# Patient Record
Sex: Male | Born: 1950 | Race: White | Hispanic: No | Marital: Single | Smoking: Never smoker
Health system: Southern US, Community
[De-identification: ages and names within clinical notes are randomized; demographics above are authoritative.]

## PROBLEM LIST (undated history)

## (undated) DIAGNOSIS — I219 Acute myocardial infarction, unspecified: Secondary | ICD-10-CM

## (undated) DIAGNOSIS — I10 Essential (primary) hypertension: Secondary | ICD-10-CM

## (undated) HISTORY — PX: CORONARY ARTERY BYPASS GRAFT: SHX141

---

## 2009-05-23 ENCOUNTER — Emergency Department: Payer: Self-pay

## 2010-04-11 ENCOUNTER — Inpatient Hospital Stay: Payer: Self-pay | Admitting: Internal Medicine

## 2010-08-01 ENCOUNTER — Observation Stay: Payer: Self-pay | Admitting: Internal Medicine

## 2013-04-14 ENCOUNTER — Emergency Department: Payer: Self-pay | Admitting: Emergency Medicine

## 2013-04-14 LAB — TROPONIN I

## 2013-04-14 LAB — CBC
HCT: 45.8 % (ref 40.0–52.0)
HGB: 15.3 g/dL (ref 13.0–18.0)
MCH: 29 pg (ref 26.0–34.0)
MCHC: 33.4 g/dL (ref 32.0–36.0)
MCV: 87 fL (ref 80–100)
Platelet: 181 10*3/uL (ref 150–440)
RBC: 5.27 10*6/uL (ref 4.40–5.90)
RDW: 15.6 % — AB (ref 11.5–14.5)
WBC: 7.5 10*3/uL (ref 3.8–10.6)

## 2013-04-14 LAB — BASIC METABOLIC PANEL
ANION GAP: 6 — AB (ref 7–16)
BUN: 20 mg/dL — AB (ref 7–18)
CALCIUM: 9.7 mg/dL (ref 8.5–10.1)
CO2: 24 mmol/L (ref 21–32)
Chloride: 102 mmol/L (ref 98–107)
Creatinine: 0.83 mg/dL (ref 0.60–1.30)
EGFR (African American): >60
Glucose: 146 mg/dL — ABNORMAL HIGH (ref 65–99)
OSMOLALITY: 270 (ref 275–301)
POTASSIUM: 4.4 mmol/L (ref 3.5–5.1)
Sodium: 132 mmol/L — ABNORMAL LOW (ref 136–145)

## 2015-02-20 ENCOUNTER — Emergency Department: Payer: Medicare Other

## 2015-02-20 ENCOUNTER — Emergency Department
Admission: EM | Admit: 2015-02-20 | Discharge: 2015-02-20 | Disposition: A | Discharge status: Home / Self Care | Payer: Medicare Other | Attending: Emergency Medicine | Admitting: Emergency Medicine

## 2015-02-20 ENCOUNTER — Encounter: Payer: Self-pay | Admitting: Emergency Medicine

## 2015-02-20 DIAGNOSIS — M25551 Pain in right hip: Secondary | ICD-10-CM | POA: Diagnosis present

## 2015-02-20 DIAGNOSIS — Z880 Allergy status to penicillin: Secondary | ICD-10-CM | POA: Diagnosis not present

## 2015-02-20 DIAGNOSIS — I10 Essential (primary) hypertension: Secondary | ICD-10-CM | POA: Diagnosis not present

## 2015-02-20 HISTORY — DX: Acute myocardial infarction, unspecified: I21.9

## 2015-02-20 HISTORY — DX: Essential (primary) hypertension: I10

## 2015-02-20 MED ORDER — TRAMADOL HCL 50 MG PO TABS: 50.0000 mg | ORAL_TABLET | Freq: Four times a day (QID) | ORAL | Status: DC | PRN

## 2015-02-20 NOTE — ED Notes (Signed)
Pt states here due to him pain since 10/31 went to see his doctor for it but states what was given isn't helping. Pt denies fall but states he walks a lot to help his heart walked a mile and then went to bed woke up with the pain and it hasn't gotten any better only worse

## 2015-02-20 NOTE — ED Notes (Addendum)
Pt reports right hip pain since 10/31; went and saw PCP, given rx but pt reports continued pain. Pt given gabapentin and meloxicam.

## 2015-02-20 NOTE — ED Provider Notes (Signed)
[facility] Emergency Department Provider Note  ____________________________________________  Time seen: Approximately 11:51 AM  I have reviewed the triage vital signs and the nursing notes.   HISTORY  Chief Complaint Hip Pain   HPI Ian Oliver is a 64 y.o. male is here with complaint of right hip pain. Patient states that he saw his doctor on 10/31 and was started on gabapentin and meloxicam. He is taking medication as prescribed and has seen no improvement. Patient states that he walks a lot due to heart disease which is aggravating his hip as well. Patient denies any injury to his hip. He states that first thing in the morning he is unable to get out of bed unless he has his cane. He denies any paresthesias in his leg or any low back pain. Currently he rates his pain as a 6 out of 10.Pain is constant right lateral hip and nonradiating.   Past Medical History  Diagnosis Date  . Hypertension   . Heart attack (HCC)     There are no active problems to display for this patient.   Past Surgical History  Procedure Laterality Date  . Coronary artery bypass graft      Current Outpatient Rx  Name  Route  Sig  Dispense  Refill  . traMADol (ULTRAM) 50 MG tablet   Oral   Take 1 tablet (50 mg total) by mouth every 6 (six) hours as needed.   20 tablet   0     Allergies Aspirin and Penicillins  No family history on file.  Social History Social History  Substance Use Topics  . Smoking status: Never Smoker   . Smokeless tobacco: None  . Alcohol Use: No    Review of Systems Constitutional: No fever/chills Cardiovascular: Denies chest pain. Respiratory: Denies shortness of breath. Gastrointestinal:  No nausea, no vomiting.  Genitourinary: Negative for dysuria. Musculoskeletal: Negative for back pain. Positive right hip pain Skin: Negative for rash. Neurological: Negative for headaches, focal weakness or numbness.  10-point ROS otherwise  negative.  ____________________________________________   PHYSICAL EXAM:  VITAL SIGNS: ED Triage Vitals  Enc Vitals Group     BP --      Pulse Rate 02/20/15 1119 57     Resp 02/20/15 1119 16     Temp 02/20/15 1119 97.8 F (36.6 C)     Temp Source 02/20/15 1119 Oral     SpO2 02/20/15 1119 96 %     Weight 02/20/15 1119 260 lb (117.935 kg)     Height 02/20/15 1119 6' (1.829 m)     Head Cir --      Peak Flow --      Pain Score 02/20/15 1120 6     Pain Loc --      Pain Edu? --      Excl. in GC? --     Constitutional: Alert and oriented. Well appearing and in no acute distress. Eyes: Conjunctivae are normal. PERRL. EOMI. Head: Atraumatic. Nose: No congestion/rhinnorhea. Neck: No stridor.   Cardiovascular: Normal rate, regular rhythm. Grossly normal heart sounds.  Good peripheral circulation. Respiratory: Normal respiratory effort.  No retractions. Lungs CTAB. Gastrointestinal: Soft and nontender. No distention. Musculoskeletal: Right hip service to palpation of the lateral and anterior lateral aspect. Range of motion increases pain. No gross deformity was noted. Patient is ambulatory in the ER with cane. Neurologic:  Normal speech and language. No gross focal neurologic deficits are appreciated. No gait instability while using a cane  area Skin:  Skin is warm, dry and intact. No rash noted. Psychiatric: Mood and affect are normal. Speech and behavior are normal.  ____________________________________________   LABS (all labs ordered are listed, but only abnormal results are displayed)  Labs Reviewed - No data to display _RADIOLOGY  Right hip x-ray per radiologist shows no evidence of hip fracture or dislocation. ____________________________________________   PROCEDURES  Procedure(s) performed: None  Critical Care performed: No  ____________________________________________   INITIAL IMPRESSION / ASSESSMENT AND PLAN / ED COURSE  Pertinent labs & imaging results  that were available during my care of the patient were reviewed by me and considered in my medical decision making (see chart for details).  Patient is continue taking meloxicam and gabapentin as prescribed by his doctor. He was given a prescription for tramadol 50 mg 1 every 6 hours when necessary severe pain. Patient is aware that this medication may cause drowsiness and increase his risk for falling. He will follow up with his primary care doctor if any continued problems. ____________________________________________   FINAL CLINICAL IMPRESSION(S) / ED DIAGNOSES  Final diagnoses:  Acute pain of right hip      Tommi Rumps, PA-C 02/20/15 1332  Emily Filbert, MD 02/20/15 [id number]

## 2017-02-14 ENCOUNTER — Encounter: Payer: Self-pay | Admitting: Emergency Medicine

## 2017-02-14 ENCOUNTER — Emergency Department
Admission: EM | Admit: 2017-02-14 | Discharge: 2017-02-14 | Disposition: A | Discharge status: Home / Self Care | Payer: Medicare Other | Attending: Emergency Medicine | Admitting: Emergency Medicine

## 2017-02-14 DIAGNOSIS — R0789 Other chest pain: Secondary | ICD-10-CM | POA: Insufficient documentation

## 2017-02-14 DIAGNOSIS — T887XXA Unspecified adverse effect of drug or medicament, initial encounter: Secondary | ICD-10-CM | POA: Insufficient documentation

## 2017-02-14 DIAGNOSIS — T43595A Adverse effect of other antipsychotics and neuroleptics, initial encounter: Secondary | ICD-10-CM | POA: Diagnosis not present

## 2017-02-14 DIAGNOSIS — I10 Essential (primary) hypertension: Secondary | ICD-10-CM | POA: Insufficient documentation

## 2017-02-14 DIAGNOSIS — Y658 Other specified misadventures during surgical and medical care: Secondary | ICD-10-CM | POA: Insufficient documentation

## 2017-02-14 DIAGNOSIS — F419 Anxiety disorder, unspecified: Secondary | ICD-10-CM | POA: Diagnosis not present

## 2017-02-14 DIAGNOSIS — H538 Other visual disturbances: Secondary | ICD-10-CM | POA: Diagnosis present

## 2017-02-14 DIAGNOSIS — T50905A Adverse effect of unspecified drugs, medicaments and biological substances, initial encounter: Secondary | ICD-10-CM

## 2017-02-14 LAB — COMPREHENSIVE METABOLIC PANEL
ALT: 18 U/L (ref 17–63)
AST: 30 U/L (ref 15–41)
Albumin: 4 g/dL (ref 3.5–5.0)
Alkaline Phosphatase: 62 U/L (ref 38–126)
Anion gap: 8 (ref 5–15)
BUN: 18 mg/dL (ref 6–20)
CHLORIDE: 104 mmol/L (ref 101–111)
CO2: 26 mmol/L (ref 22–32)
CREATININE: 0.89 mg/dL (ref 0.61–1.24)
Calcium: 9.3 mg/dL (ref 8.9–10.3)
GFR calc non Af Amer: >60 mL/min (ref >60)
Glucose, Bld: 100 mg/dL — ABNORMAL HIGH (ref 65–99)
Potassium: 4.6 mmol/L (ref 3.5–5.1)
SODIUM: 138 mmol/L (ref 135–145)
Total Bilirubin: 1.5 mg/dL — ABNORMAL HIGH (ref 0.3–1.2)
Total Protein: 7.7 g/dL (ref 6.5–8.1)

## 2017-02-14 LAB — GLUCOSE, CAPILLARY: Glucose-Capillary: 97 mg/dL (ref 65–99)

## 2017-02-14 LAB — TROPONIN I

## 2017-02-14 MED ORDER — CLONAZEPAM 0.5 MG PO TABS: 0.5000 mg | ORAL_TABLET | Freq: Two times a day (BID) | ORAL | 0 refills | Status: AC

## 2017-02-14 MED ORDER — CLONAZEPAM 0.5 MG PO TABS
0.5000 mg | ORAL_TABLET | Freq: Once | ORAL | Status: AC
  Administered 2017-02-14: 0.5 mg via ORAL
  Filled 2017-02-14: qty 1

## 2017-02-14 NOTE — ED Provider Notes (Signed)
[facility] Emergency Department Provider Note   ____________________________________________   I have reviewed the triage vital signs and the nursing notes.   HISTORY  Chief Complaint Medication Reaction    HPI Ian Oliver is a 66 y.o. male presents to the emergency department with symptoms he began to experience after medication change during a primary care visit approximately 2 weeks ago.  Patient has been taking Klonopin 0.5 mg 3 TID for several years when he was abruptly changed to buspirone 15 mg BID.  Patient noted immediately "not feeling well", blurred vision, chest discomfort and "growling in his chest" during exertional activities.  States he walks a lot and since he has been taking the new medication symptoms have limited him from walking as much. Patient also noted not sleeping well.  Patient became concerned with symptoms because he has a significant cardiac history and wanted to be examined. Patient denies fever, chills, headache, vision changes, chest pain, chest tightness, shortness of breath, abdominal pain, nausea and vomiting.  Past Medical History:  Diagnosis Date  . Heart attack (HCC)   . Hypertension     There are no active problems to display for this patient.   Past Surgical History:  Procedure Laterality Date  . CORONARY ARTERY BYPASS GRAFT      Prior to Admission medications   Medication Sig Start Date End Date Taking? Authorizing Provider  clonazePAM (KLONOPIN) 0.5 MG tablet Take 1 tablet (0.5 mg total) 2 (two) times daily for 14 days by mouth. 02/14/17 02/28/17  Little, Traci M, PA-C  traMADol (ULTRAM) 50 MG tablet Take 1 tablet (50 mg total) by mouth every 6 (six) hours as needed. 02/20/15   Tommi RumpsSummers, Rhonda L, PA-C    Allergies Aspirin and Penicillins  No family history on file.  Social History Social History   Tobacco Use  . Smoking status: Never Smoker  Substance Use Topics  . Alcohol use: No  . Drug  use: Not on file    Review of Systems Constitutional: Negative for fever/chills Eyes: Positive for intermittent blurred vision ENT:  Negative for sore throat and for difficulty swallowing Cardiovascular: Exertional chest discomfort.  Respiratory: Denies cough. Denies shortness of breath. Gastrointestinal: No abdominal pain.  No nausea, vomiting, diarrhea. Musculoskeletal: Negative for back pain. Skin: Negative for rash. Neurological: Negative for headaches. ____________________________________________   PHYSICAL EXAM:  VITAL SIGNS: Patient Vitals for the past 24 hrs:  BP Temp Temp src Pulse Resp SpO2 Height Weight  02/14/17 1239 138/78 - - (!) 56 16 95 % - -  02/14/17 1030 (!) 136/97 98 F (36.7 C) Oral (!) 41 16 99 % - -  02/14/17 0912 - - - - - - 6' (1.829 m) 117.9 kg (260 lb)  02/14/17 0909 (!) 135/58 98.1 F (36.7 C) Oral (!) 44 16 99 % - -    Constitutional: Alert and oriented. Well appearing and in no acute distress.  Eyes: Conjunctivae are normal. PERRL. EOMI  Head: Normocephalic and atraumatic. Neck:Supple.  Cardiovascular: A-flutter. Normal S1 and S2.  Good peripheral circulation. Respiratory: Normal respiratory effort without tachypnea or retractions. Lungs CTAB. No wheezes/rales/rhonchi. Good air entry to the bases with no decreased or absent breath sounds. Gastrointestinal: Bowel sounds 4 quadrants. Soft and nontender to palpation. No distention. No CVA tenderness. Musculoskeletal: Nontender with normal range of motion in all extremities. Neurologic: Normal speech and language. No gross focal neurologic deficits are appreciated. Skin:  Skin is warm, dry and intact. No rash noted.  Psychiatric: Mood and affect are normal. Speech and behavior are normal. Patient exhibits appropriate insight and judgement.  ____________________________________________   LABS (all labs ordered are listed, but only abnormal results are displayed)  Labs Reviewed  COMPREHENSIVE  METABOLIC PANEL - Abnormal; Notable for the following components:      Result Value   Glucose, Bld 100 (*)    Total Bilirubin 1.5 (*)    All other components within normal limits  TROPONIN I  GLUCOSE, CAPILLARY  CBG MONITORING, ED   ____________________________________________  EKG EKG 12-lead- A-flutter, No acute STEMI ____________________________________________  RADIOLOGY None ____________________________________________   PROCEDURES  Procedure(s) performed: no    Critical Care performed: no ____________________________________________   INITIAL IMPRESSION / ASSESSMENT AND PLAN / ED COURSE  Pertinent labs & imaging results that were available during my care of the patient were reviewed by me and considered in my medical decision making (see chart for details).   Patient presents to emergency department with medication reaction after transitioning from klonopin to buspirone.  History, physical exam findings, labs and EKG are reassuring of no acute cardiac changes, anaphylactic reaction or significant adverse reaction.  Patient noted improvement of symptoms following Klonopin 0.5 mg given during the course of care in the emergency department. Patient will be prescribed 2 weeks of Klonopin 0.5 mg twice daily and advised to schedule an appointment with his primary care provider for future prescriptions and and to discuss a plan of continuation of medication or transition to a more appropriate medication.  Also, advised the patient if he began to experience any worsening of symptoms do not hesitate to return to the emergency department if symptoms return or worsen. Patient informed of clinical course, understand medical decision-making process, and agree with plan. ____________________________________________   FINAL CLINICAL IMPRESSION(S) / ED DIAGNOSES  Final diagnoses:  Medication reaction, initial encounter  Anxiety  Discomfort in chest       NEW MEDICATIONS  STARTED DURING THIS VISIT:  This SmartLink is deprecated. Use AVSMEDLIST instead to display the medication list for a patient.   Note:  This document was prepared using Dragon voice recognition software and may include unintentional dictation errors.    Clois ComberLittle, Traci M, PA-C 02/14/17 1343    Emily FilbertWilliams, Jonathan E, MD 02/14/17 [phone]

## 2017-02-14 NOTE — ED Triage Notes (Signed)
Pt reports has been taking clonazepam and was fine but his MD, Dr. Glenis SmokerNeimeyer can no longer prescribe it to him so he put him on buspirone. Pt reports that this medicine makes his shake and not be able to sleep so patient did not sleep last night. Pt reports wants to be removed from this medicine.

## 2017-02-14 NOTE — ED Notes (Signed)
Pt called for ride at this time, will wait for ride

## 2017-02-14 NOTE — ED Notes (Signed)
Pt verbalizes d/c teaching and RX. Pt sister here to provide transportation home . Pt in NAD at time of d/c, ambulatory

## 2017-02-14 NOTE — Discharge Instructions (Signed)
Call to schedule appointment with your primary care provider for follow-up care.  If you began to experience worsening symptoms do not hesitate to return to the emergency department.

## 2019-11-09 ENCOUNTER — Other Ambulatory Visit: Payer: Self-pay

## 2019-11-09 DIAGNOSIS — Z886 Allergy status to analgesic agent status: Secondary | ICD-10-CM | POA: Insufficient documentation

## 2019-11-09 DIAGNOSIS — R42 Dizziness and giddiness: Secondary | ICD-10-CM | POA: Diagnosis present

## 2019-11-09 DIAGNOSIS — R600 Localized edema: Secondary | ICD-10-CM | POA: Insufficient documentation

## 2019-11-09 DIAGNOSIS — I4891 Unspecified atrial fibrillation: Secondary | ICD-10-CM | POA: Diagnosis not present

## 2019-11-09 DIAGNOSIS — R112 Nausea with vomiting, unspecified: Secondary | ICD-10-CM | POA: Insufficient documentation

## 2019-11-09 DIAGNOSIS — I252 Old myocardial infarction: Secondary | ICD-10-CM | POA: Diagnosis not present

## 2019-11-09 DIAGNOSIS — Z7984 Long term (current) use of oral hypoglycemic drugs: Secondary | ICD-10-CM | POA: Insufficient documentation

## 2019-11-09 DIAGNOSIS — Z951 Presence of aortocoronary bypass graft: Secondary | ICD-10-CM | POA: Insufficient documentation

## 2019-11-09 DIAGNOSIS — Z880 Allergy status to penicillin: Secondary | ICD-10-CM | POA: Diagnosis not present

## 2019-11-09 DIAGNOSIS — Z7901 Long term (current) use of anticoagulants: Secondary | ICD-10-CM | POA: Diagnosis not present

## 2019-11-09 DIAGNOSIS — I10 Essential (primary) hypertension: Secondary | ICD-10-CM | POA: Diagnosis not present

## 2019-11-09 DIAGNOSIS — Z7902 Long term (current) use of antithrombotics/antiplatelets: Secondary | ICD-10-CM | POA: Insufficient documentation

## 2019-11-09 DIAGNOSIS — Z79899 Other long term (current) drug therapy: Secondary | ICD-10-CM | POA: Insufficient documentation

## 2019-11-09 LAB — BASIC METABOLIC PANEL
Anion gap: 8 (ref 5–15)
BUN: 26 mg/dL — ABNORMAL HIGH (ref 8–23)
CO2: 27 mmol/L (ref 22–32)
Calcium: 9.7 mg/dL (ref 8.9–10.3)
Chloride: 107 mmol/L (ref 98–111)
Creatinine, Ser: 1.08 mg/dL (ref 0.61–1.24)
GFR calc Af Amer: >60 mL/min (ref >60)
GFR calc non Af Amer: >60 mL/min (ref >60)
Glucose, Bld: 164 mg/dL — ABNORMAL HIGH (ref 70–99)
Potassium: 4.7 mmol/L (ref 3.5–5.1)
Sodium: 142 mmol/L (ref 135–145)

## 2019-11-09 LAB — CBC
HCT: 44.1 % (ref 39.0–52.0)
Hemoglobin: 14.8 g/dL (ref 13.0–17.0)
MCH: 31.1 pg (ref 26.0–34.0)
MCHC: 33.6 g/dL (ref 30.0–36.0)
MCV: 92.6 fL (ref 80.0–100.0)
Platelets: 156 10*3/uL (ref 150–400)
RBC: 4.76 MIL/uL (ref 4.22–5.81)
RDW: 14.3 % (ref 11.5–15.5)
WBC: 6.4 10*3/uL (ref 4.0–10.5)
nRBC: 0 % (ref 0.0–0.2)

## 2019-11-09 LAB — URINALYSIS, COMPLETE (UACMP) WITH MICROSCOPIC
Bilirubin Urine: NEGATIVE
Glucose, UA: NEGATIVE mg/dL
Hgb urine dipstick: NEGATIVE
Ketones, ur: 5 mg/dL — AB
Leukocytes,Ua: NEGATIVE
Nitrite: NEGATIVE
Protein, ur: 30 mg/dL — AB
Specific Gravity, Urine: 1.02 (ref 1.005–1.030)
pH: 7 (ref 5.0–8.0)

## 2019-11-09 NOTE — ED Triage Notes (Signed)
Patient is hard of hearing.  Patient reports feeling dizzy.

## 2019-11-10 ENCOUNTER — Emergency Department: Payer: Medicare Other

## 2019-11-10 ENCOUNTER — Emergency Department
Admission: EM | Admit: 2019-11-10 | Discharge: 2019-11-10 | Disposition: A | Discharge status: Home / Self Care | Payer: Medicare Other | Attending: Emergency Medicine | Admitting: Emergency Medicine

## 2019-11-10 ENCOUNTER — Encounter: Payer: Self-pay | Admitting: Radiology

## 2019-11-10 DIAGNOSIS — I4891 Unspecified atrial fibrillation: Secondary | ICD-10-CM

## 2019-11-10 LAB — TSH: TSH: 0.314 u[IU]/mL — ABNORMAL LOW (ref 0.350–4.500)

## 2019-11-10 LAB — TROPONIN I (HIGH SENSITIVITY)
Troponin I (High Sensitivity): 3 ng/L (ref <18)
Troponin I (High Sensitivity): 4 ng/L (ref <18)

## 2019-11-10 LAB — MAGNESIUM: Magnesium: 2 mg/dL (ref 1.7–2.4)

## 2019-11-10 LAB — BRAIN NATRIURETIC PEPTIDE: B Natriuretic Peptide: 176 pg/mL — ABNORMAL HIGH (ref 0.0–100.0)

## 2019-11-10 LAB — T4, FREE: Free T4: 0.79 ng/dL (ref 0.61–1.12)

## 2019-11-10 MED ORDER — DILTIAZEM HCL 25 MG/5ML IV SOLN
20.0000 mg | Freq: Once | INTRAVENOUS | Status: AC
  Administered 2019-11-10: 20 mg via INTRAVENOUS
  Filled 2019-11-10: qty 5

## 2019-11-10 MED ORDER — APIXABAN 5 MG PO TABS
5.0000 mg | ORAL_TABLET | Freq: Two times a day (BID) | ORAL | 2 refills | Status: AC
Start: 2019-11-10 — End: 2020-11-09

## 2019-11-10 MED ORDER — DILTIAZEM HCL ER COATED BEADS 180 MG PO CP24
180.0000 mg | ORAL_CAPSULE | Freq: Once | ORAL | Status: AC
  Administered 2019-11-10: 180 mg via ORAL
  Filled 2019-11-10: qty 1

## 2019-11-10 MED ORDER — DILTIAZEM HCL ER COATED BEADS 180 MG PO CP24
180.0000 mg | ORAL_CAPSULE | Freq: Every day | ORAL | 2 refills | Status: AC
Start: 2019-11-10 — End: 2020-11-09

## 2019-11-10 MED ORDER — MAGNESIUM SULFATE 2 GM/50ML IV SOLN
2.0000 g | Freq: Once | INTRAVENOUS | Status: AC
  Administered 2019-11-10: 2 g via INTRAVENOUS
  Filled 2019-11-10: qty 50

## 2019-11-10 NOTE — Discharge Instructions (Addendum)
Stop taking Plavix (clopidogrel) and start taking Eliquis instead. Take 5mg  twice a day. You are also being started on a new medication called cardizem. Take it once a day. It is important that you have a cardiologist. I am referring you to Dr. . Call his office Monday morning for an appointment. Return to the ER for chest pain, dizziness, shortness of breath.

## 2019-11-10 NOTE — ED Provider Notes (Signed)
[facility] Emergency Department Provider Note  ____________________________________________  Time seen: Approximately 3:22 AM  I have reviewed the triage vital signs and the nursing notes.   HISTORY  Chief Complaint Dizziness   HPI Ian Oliver is a 69 y.o. male with a history of CABG,ypertension, vertigo presents for evaluation of dizziness.  Patient reports that he was coming into the house when he started feeling off balance.  He reports having a history of vertigo and felt the same.  He said in the past was more severe and had nausea and vomiting with it but not this time around.  The symptoms did not resolve which prompted visit to the emergency room.  No headache, no slurred speech, no dysarthria, no diplopia, no dysphagia, no unilateral weakness or numbness, no facial droop, no prior history of stroke.  No chest pain or shortness of breath, no cough, no nausea or vomiting, no fever or chills.   No personal family history of PE or DVT, no recent travel immobilization, no leg pain, no hemoptysis, no exogenous hormones.  Past Medical History:  Diagnosis Date   Heart attack (HCC)    Hypertension     There are no problems to display for this patient.   Past Surgical History:  Procedure Laterality Date   CORONARY ARTERY BYPASS GRAFT      Prior to Admission medications   Medication Sig Start Date End Date Taking? Authorizing Provider  apixaban (ELIQUIS) 5 MG TABS tablet Take 1 tablet (5 mg total) by mouth 2 (two) times daily. 11/10/19 11/09/20  Nita Sickle, MD  Cholecalciferol 125 MCG (5000 UT) TABS Take 1 tablet by mouth daily.    [provider]  clonazePAM (KLONOPIN) 0.5 MG tablet Take 1 tablet (0.5 mg total) 2 (two) times daily for 14 days by mouth. 02/14/17 11/10/19  Little, Traci M, PA-C  diltiazem (CARDIZEM CD) 180 MG 24 hr capsule Take 1 capsule (180 mg total) by mouth daily. 11/10/19 11/09/20  Nita Sickle, MD  ENTRESTO  97-103 MG Take 1 tablet by mouth 2 (two) times daily. 10/10/19   [provider]  metFORMIN (GLUCOPHAGE) 1000 MG tablet Take 1,000 mg by mouth 2 (two) times daily. 09/10/19   [provider]  metoprolol succinate (TOPROL-XL) 100 MG 24 hr tablet Take 100 mg by mouth daily. 10/22/19   [provider]  ONGLYZA 5 MG TABS tablet Take 5 mg by mouth daily. 10/23/19   [provider]  simvastatin (ZOCOR) 80 MG tablet Take 80 mg by mouth at bedtime. 07/18/19   [provider]  VASCEPA 1 g capsule Take 2 g by mouth 2 (two) times daily. 10/31/19   [provider]    Allergies Aspirin and Penicillins  No family history on file.  Social History Social History   Tobacco Use   Smoking status: Never Smoker  Substance Use Topics   Alcohol use: No   Drug use: Not on file    Review of Systems  Constitutional: Negative for fever. + dizziness Eyes: Negative for visual changes. ENT: Negative for sore throat. Neck: No neck pain  Cardiovascular: Negative for chest pain. Respiratory: Negative for shortness of breath. Gastrointestinal: Negative for abdominal pain, vomiting or diarrhea. Genitourinary: Negative for dysuria. Musculoskeletal: Negative for back pain. Skin: Negative for rash. Neurological: Negative for headaches, weakness or numbness. Psych: No SI or HI  ____________________________________________   PHYSICAL EXAM:  VITAL SIGNS: ED Triage Vitals  Enc Vitals Group  BP 11/09/19 2301 123/77     Pulse Rate 11/09/19 2301 (!) 109     Resp 11/09/19 2301 18     Temp 11/09/19 2301 97.8 F (36.6 C)     Temp Source 11/09/19 2301 Oral     SpO2 11/09/19 2301 97 %     Weight 11/09/19 2302 (!) 260 lb (117.9 kg)     Height 11/09/19 2302 6' (1.829 m)     Head Circumference --      Peak Flow --      Pain Score 11/09/19 2302 0     Pain Loc --      Pain Edu? --      Excl. in GC? --     Constitutional: Alert and oriented. Well appearing  and in no apparent distress. HEENT:      Head: Normocephalic and atraumatic.         Eyes: Conjunctivae are normal. Sclera is non-icteric.       Mouth/Throat: Mucous membranes are moist.       Neck: Supple with no signs of meningismus. Cardiovascular: Irregularly irregular rhythm with tachycardic rate  respiratory: Normal respiratory effort. Lungs are clear to auscultation bilaterally. No wheezes, crackles, or rhonchi.  Gastrointestinal: Soft, non tender. Musculoskeletal: 1+ pitting edema of b/l LE Neurologic: Normal speech and language. Face is symmetric. EOMI, PERRL, intact strength and sensation x4, no dysmetria or pronator drift. Skin: Skin is warm, dry and intact. No rash noted. Psychiatric: Mood and affect are normal. Speech and behavior are normal.  ____________________________________________   LABS (all labs ordered are listed, but only abnormal results are displayed)  Labs Reviewed  BASIC METABOLIC PANEL - Abnormal; Notable for the following components:      Result Value   Glucose, Bld 164 (*)    BUN 26 (*)    All other components within normal limits  URINALYSIS, COMPLETE (UACMP) WITH MICROSCOPIC - Abnormal; Notable for the following components:   Color, Urine YELLOW (*)    APPearance HAZY (*)    Ketones, ur 5 (*)    Protein, ur 30 (*)    Bacteria, UA RARE (*)    All other components within normal limits  BRAIN NATRIURETIC PEPTIDE - Abnormal; Notable for the following components:   B Natriuretic Peptide 176.0 (*)    All other components within normal limits  TSH - Abnormal; Notable for the following components:   TSH 0.314 (*)    All other components within normal limits  CBC  MAGNESIUM  T4, FREE  TROPONIN I (HIGH SENSITIVITY)  TROPONIN I (HIGH SENSITIVITY)   ____________________________________________  EKG  ED ECG REPORT I, Nita Sickle, the attending physician, personally viewed and interpreted this ECG.  Atrial fibrillation, rate of 124, normal  QTC, no ST elevation.  New when compared to prior. ____________________________________________  RADIOLOGY  I have personally reviewed the images performed during this visit and I agree with the Radiologist's read.   Interpretation by Radiologist:  CT Head Wo Contrast  Result Date: 11/10/2019 CLINICAL DATA:  Dizziness EXAM: CT HEAD WITHOUT CONTRAST TECHNIQUE: Contiguous axial images were obtained from the base of the skull through the vertex without intravenous contrast. COMPARISON:  None. FINDINGS: Brain: No evidence of acute territorial infarction, hemorrhage, hydrocephalus,extra-axial collection or mass lesion/mass effect. Ventricles are normal in size and contour. There is mild low-attenuation changes in the deep white matter consistent with small vessel ischemia. Vascular: No hyperdense vessel or unexpected calcification. Skull: The skull is intact. No fracture or focal  lesion identified. Sinuses/Orbits: The visualized paranasal sinuses and mastoid air cells are clear. The orbits and globes intact. Other: None IMPRESSION: No acute intracranial abnormality. Findings consistent with mild chronic small vessel ischemia Electronically Signed   By: Jonna ClarkBindu  Avutu M.D.   On: 11/10/2019 01:25   DG Chest Portable 1 View  Result Date: 11/10/2019 CLINICAL DATA:  Dizzy.  Atrial fibrillation. EXAM: PORTABLE CHEST 1 VIEW COMPARISON:  04/11/2010 FINDINGS: Cardiac silhouette is normal in size. Changes from previous CABG surgery, new since the prior exam. No mediastinal or hilar masses. Clear lungs.  No pleural effusion or pneumothorax. Skeletal structures are grossly intact. IMPRESSION: No acute cardiopulmonary disease. Electronically Signed   By: Amie Portlandavid  Ormond M.D.   On: 11/10/2019 04:13    ____________________________________________   PROCEDURES  Procedure(s) performed:yes .1-3 Lead EKG Interpretation Performed by: Nita SickleVeronese, [place], MD Authorized by: Nita SickleVeronese, [place], MD     Interpretation:  abnormal     ECG rate assessment: tachycardic     Rhythm: atrial fibrillation     Critical Care performed: yes  CRITICAL CARE Performed by: Nita Sicklearolina Jerrid Forgette  ?  Total critical care time: 40 min  Critical care time was exclusive of separately billable procedures and treating other patients.  Critical care was necessary to treat or prevent imminent or life-threatening deterioration.  Critical care was time spent personally by me on the following activities: development of treatment plan with patient and/or surrogate as well as nursing, discussions with consultants, evaluation of patient's response to treatment, examination of patient, obtaining history from patient or surrogate, ordering and performing treatments and interventions, ordering and review of laboratory studies, ordering and review of radiographic studies, pulse oximetry and re-evaluation of patient's condition.  ____________________________________________   INITIAL IMPRESSION / ASSESSMENT AND PLAN / ED COURSE  69 y.o. male with a history of CABG, hypertension, vertigo presents for evaluation of dizziness.  Patient found to be in A. fib with RVR.  Patient denies any known history of A. fib however when looking in his medical record I do see an EKG from 2018 showing atrial flutter.  Patient is not sure what medications he is on or if he takes any blood thinners.  I am unable to find any records from a cardiology or recent visit and earlier than 2018.  He is completely neurologically intact and head CT shows no evidence of acute intracranial abnormality, confirmed by radiology.  At this time I believe his dizziness is due to A. fib with RVR and not a stroke.  Electrolytes are within normal limits.  Initial troponin is negative.  Mild hyperglycemia with no evidence of DKA.  UA is negative for UTI.  Will give IV magnesium, IV Cardizem and transition to p.o. Cardizem for rate control.  Patient denies chest pain, shortness of breath he  has no tachypnea or hypoxia therefore less likely PE.  Patient has never had a blood clot before.  History gathered from patient and his sister was at bedside.  Plan discussed with both of them.  Old medical records reviewed.   _________________________ 6:16 AM on 11/10/2019 -----------------------------------------  Patient's rate has been extremely well controlled on p.o. Cardizem currently in the 80s.  Patient reports full resolution of his dizziness.  Troponin x2 with no signs of ischemia. We were able to contact patient's pharmacy to determine his medications.  He is on Plavix.  Recommended stopping Plavix and starting Eliquis which should cover him for his CAD and A. fib.  I am referring him to a  cardiologist.  Patient will also be started on Cardizem.  Discussed my standard return precautions and home care with patient and his sister.    _____________________________________________ Please note:  Patient was evaluated in Emergency Department today for the symptoms described in the history of present illness. Patient was evaluated in the context of the global COVID-19 pandemic, which necessitated consideration that the patient might be at risk for infection with the SARS-CoV-2 virus that causes COVID-19. Institutional protocols and algorithms that pertain to the evaluation of patients at risk for COVID-19 are in a state of rapid change based on information released by regulatory bodies including the CDC and federal and state organizations. These policies and algorithms were followed during the patient's care in the ED.  Some ED evaluations and interventions may be delayed as a result of limited staffing during the pandemic.    Controlled Substance Database was reviewed by me. ____________________________________________   FINAL CLINICAL IMPRESSION(S) / ED DIAGNOSES   Final diagnoses:  Atrial fibrillation with RVR (HCC)      NEW MEDICATIONS STARTED DURING THIS VISIT:  ED  Discharge Orders         Ordered    apixaban (ELIQUIS) 5 MG TABS tablet  2 times daily     Discontinue  Reprint     11/10/19 0616    diltiazem (CARDIZEM CD) 180 MG 24 hr capsule  Daily     Discontinue  Reprint     11/10/19 0616           Note:  This document was prepared using Dragon voice recognition software and may include unintentional dictation errors.    Don Perking, [place], MD 11/10/19 [phone]

## 2020-11-09 IMAGING — CT CT HEAD W/O CM
3 series · 16 of 47 positions shown, 19 images · non-contrast
Comparison: None.

CLINICAL DATA: Dizziness

EXAM:
CT HEAD WITHOUT CONTRAST
TECHNIQUE: Contiguous axial images were obtained from the base of the skull
through the vertex without intravenous contrast.

[Series 3: head wo · axial · 0.44mm/px · z∈[-154,-29]mm · 10 of 31 slices shown, 13 images]
[im 3/31  brain]
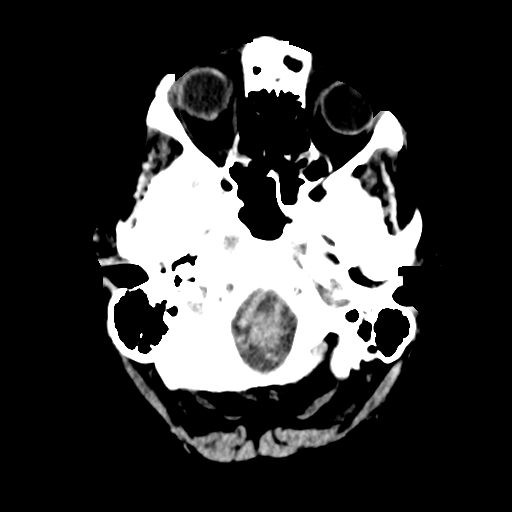
[im 3/31  bone]
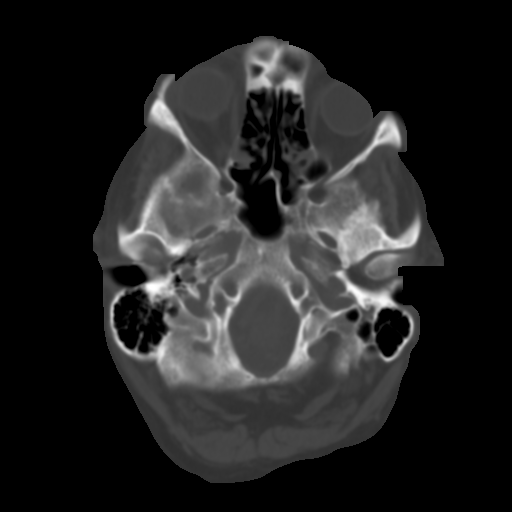
[im 6/31  brain]
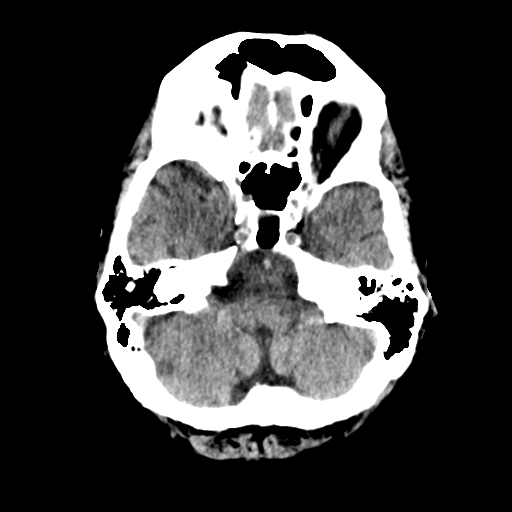
[im 9/31  brain]
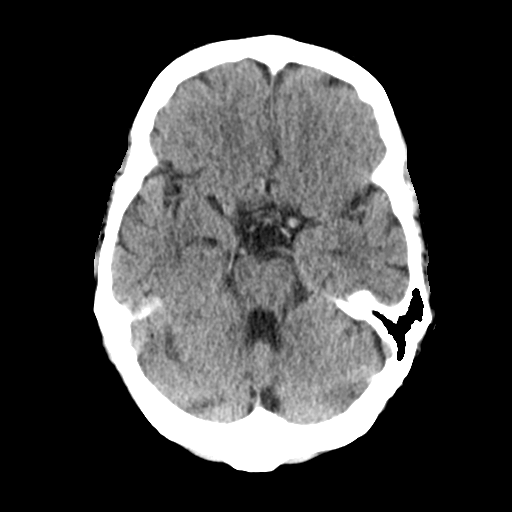
[im 11/31  brain]
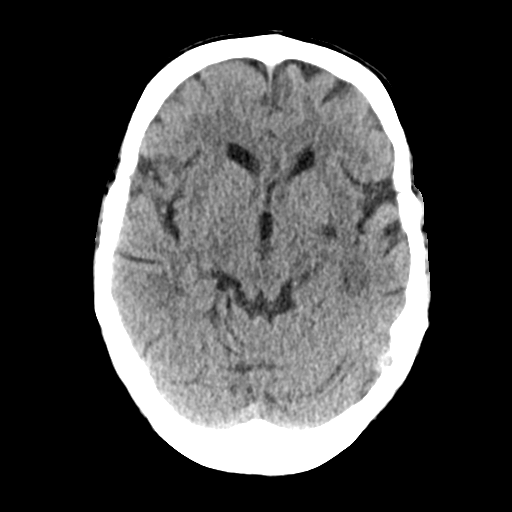
[im 14/31  brain]
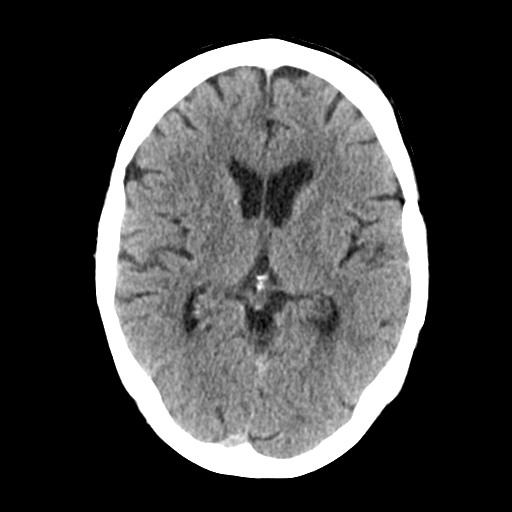
[im 14/31  bone]
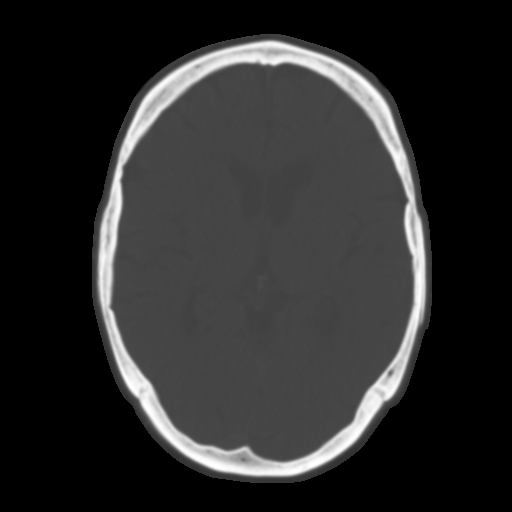
[im 17/31  brain]
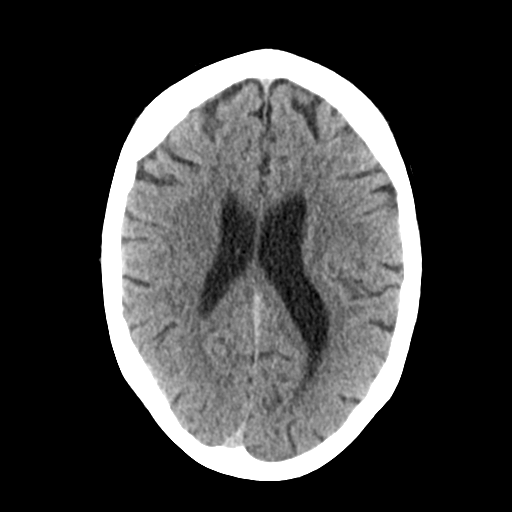
[im 20/31  brain]
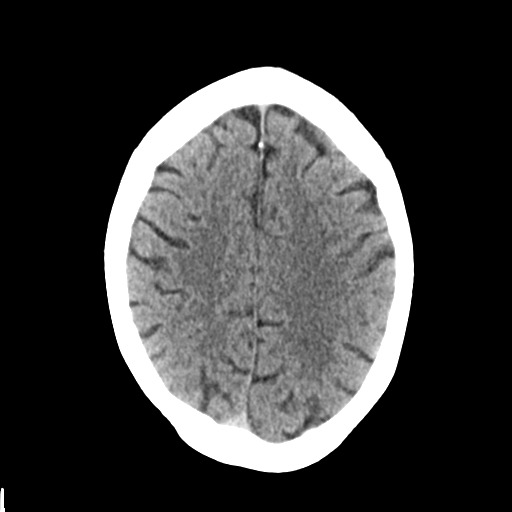
[im 23/31  brain]
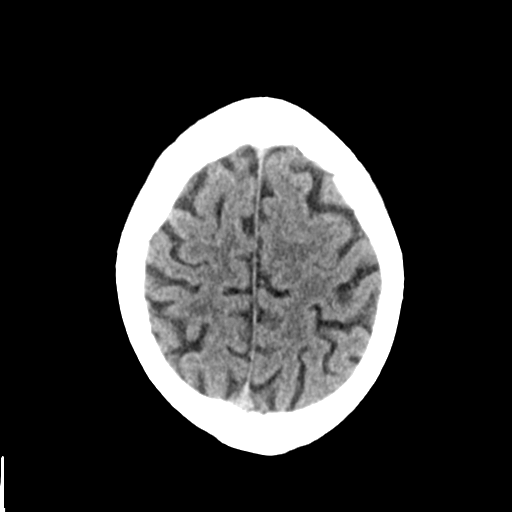
[im 25/31  brain]
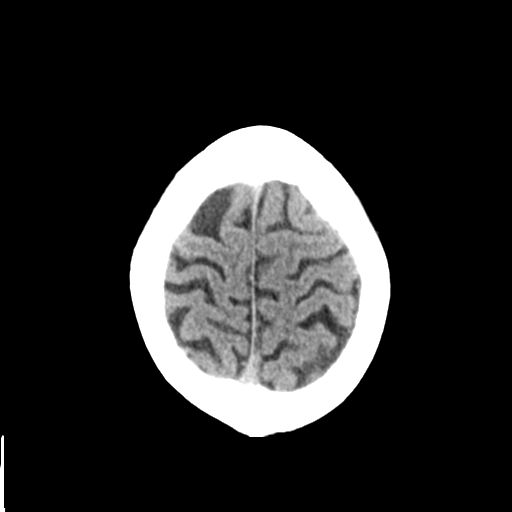
[im 25/31  bone]
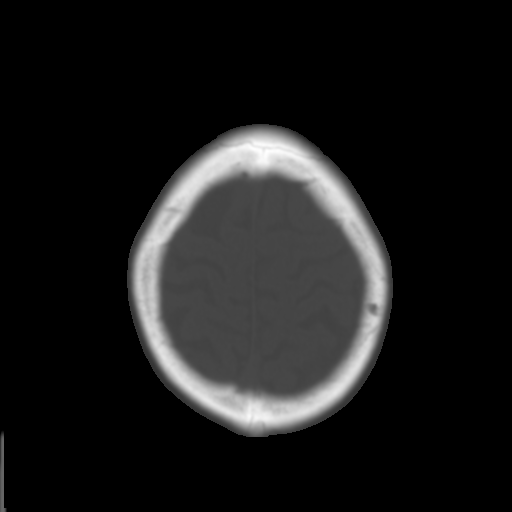
[im 28/31  brain]
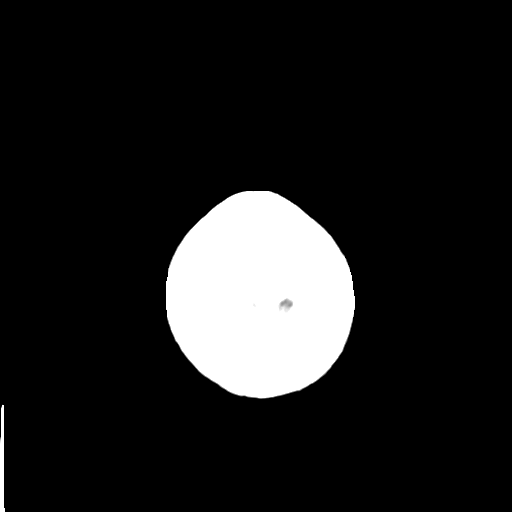

[Series 4: coronal soft tissue · coronal · 0.30mm/px · 3 of 68 slices shown]
[im 23/68  brain]
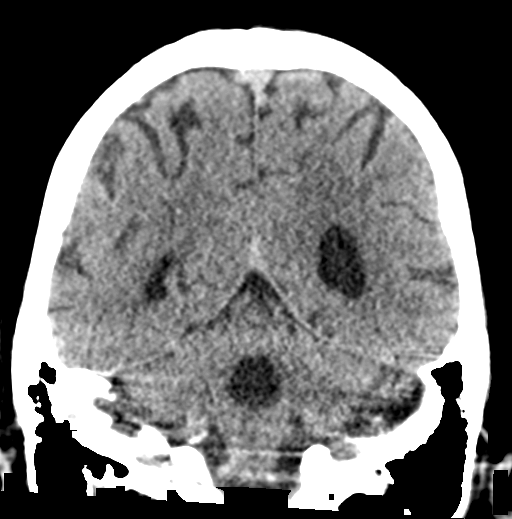
[im 30/68  brain]
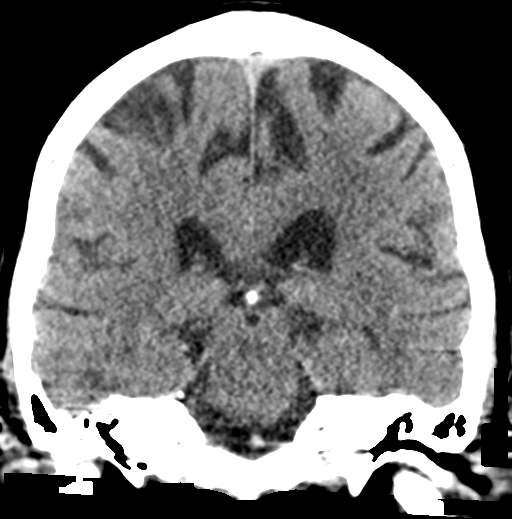
[im 38/68  brain]
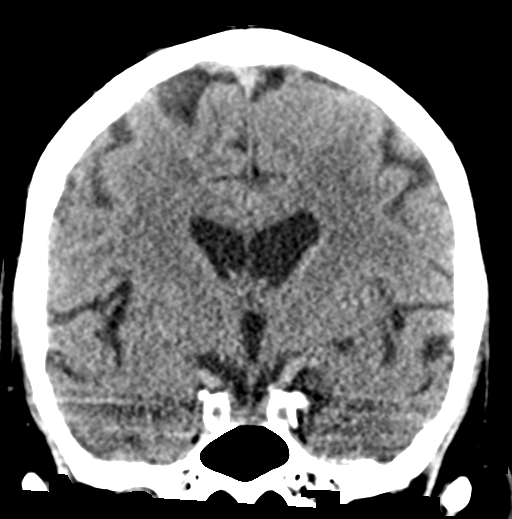

[Series 5: sagittal soft tissue · sagittal · 0.30mm/px · 3 of 52 slices shown]
[im 18/52  brain]
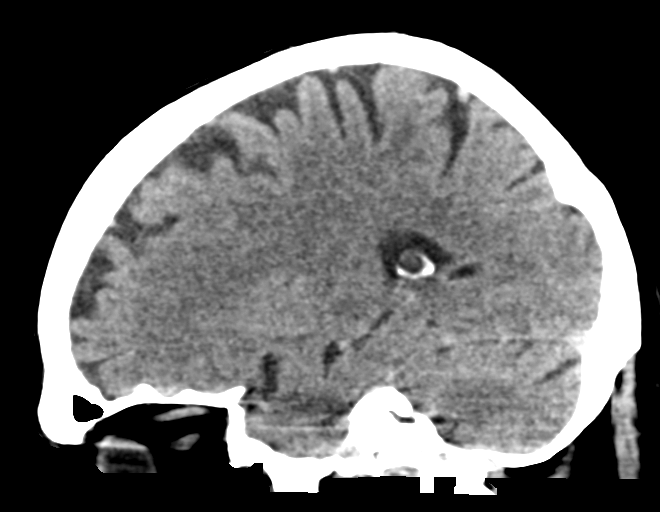
[im 26/52  brain]
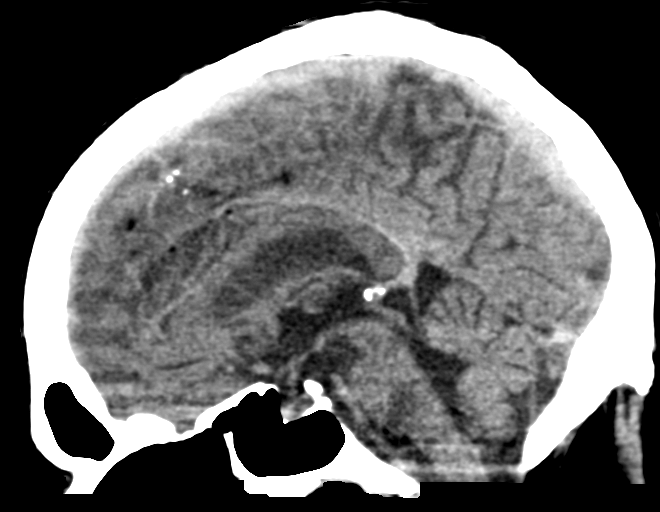
[im 35/52  brain]
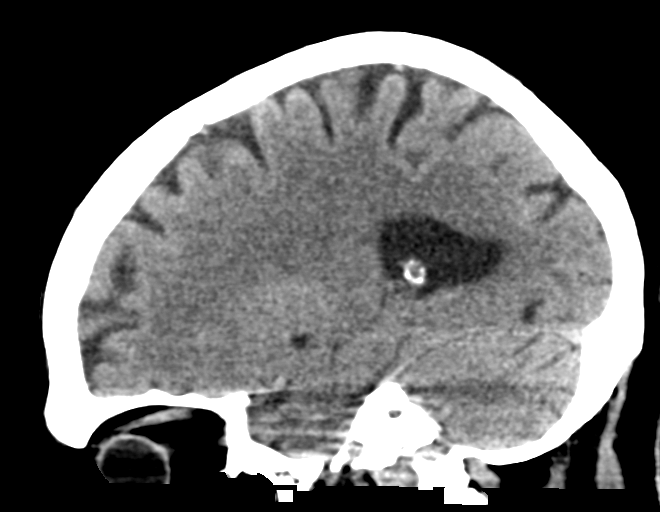

[16 of 47 positions shown; findings below may reference images not displayed]

FINDINGS: Brain: No evidence of acute territorial infarction, hemorrhage,
hydrocephalus,extra-axial collection or mass lesion/mass effect.
Ventricles are normal in size and contour. There is mild
low-attenuation changes in the deep white matter consistent with
small vessel ischemia.

Vascular: No hyperdense vessel or unexpected calcification.

Skull: The skull is intact. No fracture or focal lesion identified.

Sinuses/Orbits: The visualized paranasal sinuses and mastoid air
cells are clear. The orbits and globes intact.

Other: None
IMPRESSION: No acute intracranial abnormality.

Findings consistent with mild chronic small vessel ischemia

## 2023-04-07 ENCOUNTER — Other Ambulatory Visit: Payer: Self-pay

## 2023-04-07 ENCOUNTER — Emergency Department
Admission: EM | Admit: 2023-04-07 | Discharge: 2023-04-07 | Disposition: A | Discharge status: Home / Self Care | Payer: Medicare Other | Attending: Emergency Medicine | Admitting: Emergency Medicine

## 2023-04-07 DIAGNOSIS — X04XXXA Exposure to ignition of highly flammable material, initial encounter: Secondary | ICD-10-CM | POA: Diagnosis not present

## 2023-04-07 DIAGNOSIS — R000 Tachycardia, unspecified: Secondary | ICD-10-CM | POA: Diagnosis not present

## 2023-04-07 DIAGNOSIS — T2010XA Burn of first degree of head, face, and neck, unspecified site, initial encounter: Secondary | ICD-10-CM | POA: Insufficient documentation

## 2023-04-07 DIAGNOSIS — T2017XA Burn of first degree of neck, initial encounter: Secondary | ICD-10-CM | POA: Insufficient documentation

## 2023-04-07 DIAGNOSIS — T310 Burns involving less than 10% of body surface: Secondary | ICD-10-CM | POA: Diagnosis not present

## 2023-04-07 DIAGNOSIS — T2000XA Burn of unspecified degree of head, face, and neck, unspecified site, initial encounter: Secondary | ICD-10-CM | POA: Diagnosis present
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
## 2023-04-07 NOTE — ED Notes (Signed)
 Patient discharged from ED by provider. Discharge instructions reviewed with patient and all questions answered. Patient ambulatory from ED in NAD.
